# Patient Record
Sex: Female | Born: 1995 | Race: White | Hispanic: Yes | Marital: Single | Smoking: Never smoker
Health system: Southern US, Community
[De-identification: ages and names within clinical notes are randomized; demographics above are authoritative.]

## PROBLEM LIST (undated history)

## (undated) DIAGNOSIS — IMO0001 Reserved for inherently not codable concepts without codable children: Secondary | ICD-10-CM

## (undated) HISTORY — DX: Reserved for inherently not codable concepts without codable children: IMO0001

## (undated) HISTORY — PX: NO PAST SURGERIES: SHX2092

---

## 2016-01-12 ENCOUNTER — Encounter (HOSPITAL_COMMUNITY): Payer: Self-pay | Admitting: Emergency Medicine

## 2016-01-12 DIAGNOSIS — K625 Hemorrhage of anus and rectum: Secondary | ICD-10-CM | POA: Insufficient documentation

## 2016-01-12 LAB — COMPREHENSIVE METABOLIC PANEL
ALT: 23 U/L (ref 14–54)
ANION GAP: 11 (ref 5–15)
AST: 29 U/L (ref 15–41)
Albumin: 4 g/dL (ref 3.5–5.0)
Alkaline Phosphatase: 76 U/L (ref 38–126)
BILIRUBIN TOTAL: 0.4 mg/dL (ref 0.3–1.2)
BUN: 10 mg/dL (ref 6–20)
CHLORIDE: 106 mmol/L (ref 101–111)
CO2: 23 mmol/L (ref 22–32)
Calcium: 9.8 mg/dL (ref 8.9–10.3)
Creatinine, Ser: 0.66 mg/dL (ref 0.44–1.00)
GFR calc Af Amer: >60 mL/min (ref >60)
GFR calc non Af Amer: >60 mL/min (ref >60)
GLUCOSE: 103 mg/dL — AB (ref 65–99)
POTASSIUM: 3.7 mmol/L (ref 3.5–5.1)
SODIUM: 140 mmol/L (ref 135–145)
Total Protein: 7.4 g/dL (ref 6.5–8.1)

## 2016-01-12 LAB — CBC
HEMATOCRIT: 39.9 % (ref 36.0–46.0)
HEMOGLOBIN: 13.9 g/dL (ref 12.0–15.0)
MCH: 29.3 pg (ref 26.0–34.0)
MCHC: 34.8 g/dL (ref 30.0–36.0)
MCV: 84.2 fL (ref 78.0–100.0)
Platelets: 299 10*3/uL (ref 150–400)
RBC: 4.74 MIL/uL (ref 3.87–5.11)
RDW: 13.1 % (ref 11.5–15.5)
WBC: 9.2 10*3/uL (ref 4.0–10.5)

## 2016-01-12 LAB — TYPE AND SCREEN
ABO/RH(D): O POS
ANTIBODY SCREEN: NEGATIVE

## 2016-01-12 NOTE — ED Notes (Signed)
Pt c/o blood in stool x3 weeks. States every bowel movement she has is bright red blood in it. Denies dizziness, n/v/d, or abdominal pain. Ambulatory, aaox4, in nad.

## 2016-01-13 ENCOUNTER — Emergency Department (HOSPITAL_COMMUNITY)
Admission: EM | Admit: 2016-01-13 | Discharge: 2016-01-13 | Disposition: A | Discharge status: Home / Self Care | Payer: Self-pay | Attending: Emergency Medicine | Admitting: Emergency Medicine

## 2016-01-13 DIAGNOSIS — K625 Hemorrhage of anus and rectum: Secondary | ICD-10-CM

## 2016-01-13 LAB — ABO/RH: ABO/RH(D): O POS

## 2016-01-13 LAB — POC OCCULT BLOOD, ED: FECAL OCCULT BLD: POSITIVE — AB

## 2016-01-13 NOTE — ED Provider Notes (Signed)
CSN: [id number]     Arrival date & time 01/12/16  2058 History  By signing my name below, I, Rohini Rajnarayanan, attest that this documentation has been prepared under the direction and in the presence of Pricilla Loveless, MD Electronically Signed: Charlean Merl, ED Scribe 01/13/2016 at 2:01 AM.  Chief Complaint  Patient presents with  . Rectal Bleeding   The history is provided by the patient. No language interpreter was used.   HPI Comments: Priscilla Ortiz is a 20 y.o. female who presents to the Emergency Department complaining of hematochezia x3 weeks. Pt only has bowel movements every couple of days. Pt states that the blood is bright red and is in the water along with her stool. Pt denies any constipation, pain with bowel movements, dizziness, n/v/d, fever, dysuria, hematuria, or abdominal pain. Pt denies any long distance travel.  No family history of Chron's or Ulcerative Colitis.   History reviewed. No pertinent past medical history. History reviewed. No pertinent past surgical history. No family history on file. Social History  Substance Use Topics  . Smoking status: Never Smoker   . Smokeless tobacco: None  . Alcohol Use: No   OB History    No data available     Review of Systems  Constitutional: Negative for fever.  Gastrointestinal: Positive for blood in stool (Bright red blood). Negative for nausea, vomiting, abdominal pain, diarrhea and constipation.  Genitourinary: Negative for dysuria and hematuria.  Neurological: Negative for dizziness.  All other systems reviewed and are negative.  Allergies  Review of patient's allergies indicates no known allergies.  Home Medications   Prior to Admission medications   Not on File   BP 115/76 mmHg  Pulse 95  Temp(Src) 98.7 F (37.1 C) (Oral)  Resp 16  SpO2 98%  LMP 01/05/2016 Physical Exam  Constitutional: She is oriented to person, place, and time. She appears well-developed and well-nourished.  HENT:   Head: Normocephalic and atraumatic.  Right Ear: External ear normal.  Left Ear: External ear normal.  Nose: Nose normal.  Eyes: Right eye exhibits no discharge. Left eye exhibits no discharge.  Cardiovascular: Normal rate, regular rhythm and normal heart sounds.   Pulmonary/Chest: Effort normal and breath sounds normal.  Abdominal: Soft. There is no tenderness.  Genitourinary:  Small skin tag vs. Non-thrombosed hemorrhoid at 6PM. No palpable rectal abnormalities. Minimal brown stool with no gross blood.   Neurological: She is alert and oriented to person, place, and time.  Skin: Skin is warm and dry.  Nursing note and vitals reviewed.   ED Course  Procedures  DIAGNOSTIC STUDIES: Oxygen Saturation is 98% on RA, normal by my interpretation.    COORDINATION OF CARE: 1:43 AM-Discussed treatment plan which includes blood work, hemoccult, and cardiac monitoring with pt at bedside and pt agreed to plan.   Labs Review Labs Reviewed  COMPREHENSIVE METABOLIC PANEL - Abnormal; Notable for the following:    Glucose, Bld 103 (*)    All other components within normal limits  POC OCCULT BLOOD, ED - Abnormal; Notable for the following:    Fecal Occult Bld POSITIVE (*)    All other components within normal limits  CBC  TYPE AND SCREEN  ABO/RH    Imaging Review No results found. I have personally reviewed and evaluated these images and lab results as part of my medical decision-making.   EKG Interpretation None      MDM   Final diagnoses:  Bright red blood per rectum    Patient  is well appearing here with normal vitals and benign exam. Occult blood positive on exam but no gross blood. No abdominal pain, vomiting, or fevers. No history of IBD or FH. Given the length of symptoms (3 weeks) without acute instability or signs that suggest more severe disease, will refer to GI as outpatient.    I personally performed the services described in this documentation, which was scribed in my  presence. The recorded information has been reviewed and is accurate.      Pricilla Loveless, MD 01/13/16 [phone]

## 2016-06-25 ENCOUNTER — Encounter (INDEPENDENT_AMBULATORY_CARE_PROVIDER_SITE_OTHER): Payer: Self-pay

## 2016-06-25 ENCOUNTER — Ambulatory Visit (INDEPENDENT_AMBULATORY_CARE_PROVIDER_SITE_OTHER): Payer: 59 | Admitting: Internal Medicine

## 2016-06-25 ENCOUNTER — Encounter: Payer: Self-pay | Admitting: Internal Medicine

## 2016-06-25 VITALS — BP 90/54 | HR 76 | Ht 58.25 in | Wt 125.1 lb

## 2016-06-25 DIAGNOSIS — K625 Hemorrhage of anus and rectum: Secondary | ICD-10-CM | POA: Diagnosis not present

## 2016-06-25 NOTE — Patient Instructions (Signed)

## 2016-06-25 NOTE — Progress Notes (Signed)
HISTORY OF PRESENT ILLNESS:  Priscilla Ortiz is a 20 y.o. female , [employer] student who is referred by the [facility] room emergency physician Dr. Criss Alvine regarding rectal bleeding. Patient reports problems with rectal bleeding since January 2017. She describes 2-3 days of bright red blood per rectum every few weeks. She notices blood in the toilet bowl and on the tissue. She denies significant associated rectal pain. She has had no change in her bowel habits and describes a regular bowel movement every other day. She denies abdominal pain or weight loss. No family history of colon cancer. Review of outside records from the emergency room shows unremarkable comprehensive metabolic panel and CBC. Hemoglobin was 13.9. Rectal exam in the emergency room was said to show a small tag without palpable abnormalities and brown stool. The stool was Hemoccult positive. GI referral recommended.  REVIEW OF SYSTEMS:  All non-GI ROS negative upon comprehensive review  Past Medical History:  Diagnosis Date  . No diagnosis     Past Surgical History:  Procedure Laterality Date  . NO PAST SURGERIES      Social History Priscilla Ortiz  reports that she has never smoked. She has never used smokeless tobacco. She reports that she does not drink alcohol or use drugs.  Family history is unknown by patient.  No Known Allergies     PHYSICAL EXAMINATION: Vital signs: BP (!) 90/54 (BP Location: Left Arm, Patient Position: Sitting, Cuff Size: Normal)   Pulse 76   Ht 4' 10.25" (1.48 m) Comment: height measured without shoes  Wt 125 lb 2 oz (56.8 kg)   LMP 06/11/2016   BMI 25.93 kg/m   Constitutional: generally well-appearing, no acute distress Psychiatric: alert and oriented x3, cooperative Eyes: extraocular movements intact, anicteric, conjunctiva pink Mouth: oral pharynx moist, no lesions Neck: supple no lymphadenopathy Cardiovascular: heart regular rate and rhythm, no murmur Lungs: clear to  auscultation bilaterally Abdomen: soft, nontender, nondistended, no obvious ascites, no peritoneal signs, normal bowel sounds, no organomegaly Rectal: No external abnormalities. No tenderness or mass. Stool is brown and Hemoccult positive Extremities: no clubbing cyanosis or lower extremity edema bilaterally Skin: no lesions on visible extremities Neuro: No focal deficits. Normal DTRs  ASSESSMENT:  #1. Ongoing intermittent rectal bleeding of uncertain etiology. #2. Hemoccult-positive stool on 2 separate occasions   PLAN:  #1. Colonoscopy.The nature of the procedure, as well as the risks, benefits, and alternatives were carefully and thoroughly reviewed with the patient. Ample time for discussion and questions allowed. The patient understood, was satisfied, and agreed to proceed.  A copy of this consultation note has been sent to Dr. Criss Alvine

## 2016-07-01 ENCOUNTER — Ambulatory Visit (AMBULATORY_SURGERY_CENTER): Payer: 59 | Admitting: Internal Medicine

## 2016-07-01 ENCOUNTER — Encounter: Payer: Self-pay | Admitting: Internal Medicine

## 2016-07-01 VITALS — BP 111/56 | HR 87 | Temp 97.7°F | Resp 19 | Ht <= 58 in | Wt 125.0 lb

## 2016-07-01 DIAGNOSIS — K625 Hemorrhage of anus and rectum: Secondary | ICD-10-CM | POA: Diagnosis present

## 2016-07-01 DIAGNOSIS — R195 Other fecal abnormalities: Secondary | ICD-10-CM

## 2016-07-01 MED ORDER — SODIUM CHLORIDE 0.9 % IV SOLN: 500.0000 mL | INTRAVENOUS | Status: DC

## 2016-07-01 NOTE — Op Note (Signed)
[facility] Patient Name: Priscilla Ortiz Procedure Date: 07/01/2016 7:56 AM MRN: [id number] Endoscopist: Wilhemina Bonito. Marina Goodell , MD Age: 20 Referring MD:  Date of Birth: [birth year] Gender: Female Account #: [id number] Procedure:                Colonoscopy Indications:              Heme positive stool, Rectal bleeding Medicines:                Monitored Anesthesia Care Procedure:                Pre-Anesthesia Assessment:                           - Prior to the procedure, a History and Physical                            was performed, and patient medications and                            allergies were reviewed. The patient's tolerance of                            previous anesthesia was also reviewed. The risks                            and benefits of the procedure and the sedation                            options and risks were discussed with the patient.                            All questions were answered, and informed consent                            was obtained. Prior Anticoagulants: The patient has                            taken no previous anticoagulant or antiplatelet                            agents. ASA Grade Assessment: I - A normal, healthy                            patient. After reviewing the risks and benefits,                            the patient was deemed in satisfactory condition to                            undergo the procedure.                           After obtaining informed consent, the colonoscope  was passed under direct vision. Throughout the                            procedure, the patient's blood pressure, pulse, and                            oxygen saturations were monitored continuously. The                            Model CF-HQ190L [id number]) scope was introduced                            through the anus and advanced to the the cecum,                            identified by appendiceal orifice  and ileocecal                            valve. The ileocecal valve, appendiceal orifice,                            and rectum were photographed. The quality of the                            bowel preparation was excellent. The colonoscopy                            was performed without difficulty. The patient                            tolerated the procedure well. The bowel preparation                            used was SUPREP. Scope In: 8:26:22 AM Scope Out: 8:34:20 AM Scope Withdrawal Time: 0 hours 5 minutes 58 seconds  Total Procedure Duration: 0 hours 7 minutes 58 seconds  Findings:                 The digital rectal exam was normal.                           The entire examined colon appeared normal on direct                            and retroflexion views. Complications:            No immediate complications. Estimated blood loss:                            None. Estimated Blood Loss:     Estimated blood loss: none. Impression:               - The entire examined colon is normal on direct and                            retroflexion  views.                           - No specimens collected. Recommendation:           - Repeat colonoscopy at age 72 for screening                            purposes.                           - Patient has a contact number available for                            emergencies. The signs and symptoms of potential                            delayed complications were discussed with the                            patient. Return to normal activities tomorrow.                            Written discharge instructions were provided to the                            patient.                           - Resume previous diet.                           - Continue present medications. Wilhemina Bonito. Marina Goodell, MD 07/01/2016 8:42:17 AM This report has been signed electronically.

## 2016-07-01 NOTE — Progress Notes (Signed)
To pacu vss patent aw reprot to rn 

## 2016-07-01 NOTE — Patient Instructions (Signed)
YOU HAD AN ENDOSCOPIC PROCEDURE TODAY AT THE [facility]:   Refer to the procedure report that was given to you for any specific questions about what was found during the examination.  If the procedure report does not answer your questions, please call your gastroenterologist to clarify.  If you requested that your care partner not be given the details of your procedure findings, then the procedure report has been included in a sealed envelope for you to review at your convenience later.  YOU SHOULD EXPECT: Some feelings of bloating in the abdomen. Passage of more gas than usual.  Walking can help get rid of the air that was put into your GI tract during the procedure and reduce the bloating. If you had a lower endoscopy (such as a colonoscopy or flexible sigmoidoscopy) you may notice spotting of blood in your stool or on the toilet paper. If you underwent a bowel prep for your procedure, you may not have a normal bowel movement for a few days.  Please Note:  You might notice some irritation and congestion in your nose or some drainage.  This is from the oxygen used during your procedure.  There is no need for concern and it should clear up in a day or so.  SYMPTOMS TO REPORT IMMEDIATELY:   Following lower endoscopy (colonoscopy or flexible sigmoidoscopy):  Excessive amounts of blood in the stool  Significant tenderness or worsening of abdominal pains  Swelling of the abdomen that is new, acute  Fever of 100F or higher   For urgent or emergent issues, a gastroenterologist can be reached at any hour by calling [phone].   DIET: Your first meal following the procedure should be a small meal and then it is ok to progress to your normal diet. Heavy or fried foods are harder to digest and may make you feel nauseous or bloated.  Likewise, meals heavy in dairy and vegetables can increase bloating.  Drink plenty of fluids but you should avoid alcoholic beverages for 24  hours.  ACTIVITY:  You should plan to take it easy for the rest of today and you should NOT DRIVE or use heavy machinery until tomorrow (because of the sedation medicines used during the test).    FOLLOW UP: Our staff will call the number listed on your records the next business day following your procedure to check on you and address any questions or concerns that you may have regarding the information given to you following your procedure. If we do not reach you, we will leave a message.  However, if you are feeling well and you are not experiencing any problems, there is no need to return our call.  We will assume that you have returned to your regular daily activities without incident.  If any biopsies were taken you will be contacted by phone or by letter within the next 1-3 weeks.  Please call us at [phone] if you have not heard about the biopsies in 3 weeks.    SIGNATURES/CONFIDENTIALITY: You and/or your care partner have signed paperwork which will be entered into your electronic medical record.  These signatures attest to the fact that that the information above on your After Visit Summary has been reviewed and is understood.  Full responsibility of the confidentiality of this discharge information lies with you and/or your care-partner.   Resume medications. 

## 2016-07-02 ENCOUNTER — Telehealth: Payer: Self-pay | Admitting: *Deleted

## 2016-07-02 NOTE — Telephone Encounter (Signed)
  Follow up Call-  Call back number 07/01/2016  Post procedure Call Back phone  # [phone]  Permission to leave phone message Yes     No answer, left message

## 2016-09-09 ENCOUNTER — Ambulatory Visit (INDEPENDENT_AMBULATORY_CARE_PROVIDER_SITE_OTHER): Payer: 59 | Admitting: Pediatrics

## 2016-09-09 ENCOUNTER — Encounter: Payer: Self-pay | Admitting: Pediatrics

## 2016-09-09 VITALS — BP 100/66 | HR 86 | Ht <= 58 in | Wt 131.0 lb

## 2016-09-09 DIAGNOSIS — Z3202 Encounter for pregnancy test, result negative: Secondary | ICD-10-CM | POA: Diagnosis not present

## 2016-09-09 DIAGNOSIS — Z30017 Encounter for initial prescription of implantable subdermal contraceptive: Secondary | ICD-10-CM

## 2016-09-09 DIAGNOSIS — Z3042 Encounter for surveillance of injectable contraceptive: Secondary | ICD-10-CM | POA: Insufficient documentation

## 2016-09-09 DIAGNOSIS — Z113 Encounter for screening for infections with a predominantly sexual mode of transmission: Secondary | ICD-10-CM

## 2016-09-09 LAB — POCT RAPID HIV: Rapid HIV, POC: NEGATIVE

## 2016-09-09 LAB — POCT URINE PREGNANCY: Preg Test, Ur: NEGATIVE

## 2016-09-09 MED ORDER — ETONOGESTREL 68 MG ~~LOC~~ IMPL
68.0000 mg | DRUG_IMPLANT | Freq: Once | SUBCUTANEOUS | Status: AC
  Administered 2016-09-09: 68 mg via SUBCUTANEOUS

## 2016-09-09 NOTE — Progress Notes (Signed)
Nexplanon Insertion  No contraindications for placement.  No liver disease, no unexplained vaginal bleeding, no h/o breast cancer, no h/o blood clots.  No LMP recorded (lmp unknown).  UHCG: Negative  Last Unprotected sex:  None- is on depo and in window  Risks & benefits of Nexplanon discussed The nexplanon device was purchased and [facility]. Packaging instructions supplied to patient Consent form signed  The patient denies any allergies to anesthetics or antiseptics.  Procedure: Pt was placed in supine position. The left arm was flexed at the elbow and externally rotated so that her wrist was parallel to her ear The medial epicondyle of the left arm was identified The insertions site was marked 8 cm proximal to the medial epicondyle The insertion site was cleaned with Betadine The area surrounding the insertion site was covered with a sterile drape 1% lidocaine was injected just under the skin at the insertion site extending 4 cm proximally. The sterile preloaded disposable Nexaplanon applicator was removed from the sterile packaging The applicator needle was inserted at a 30 degree angle at 8 cm proximal to the medial epicondyle as marked The applicator was lowered to a horizontal position and advanced just under the skin for the full length of the needle The slider on the applicator was retracted fully while the applicator remained in the same position, then the applicator was removed. The implant was confirmed via palpation as being in position The implant position was demonstrated to the patient Pressure dressing was applied to the patient.  The patient was instructed to removed the pressure dressing in 24 hrs.  The patient was advised to move slowly from a supine to an upright position  The patient denied any concerns or complaints  The patient was instructed to schedule a follow-up appt in 1 month and to call sooner if any concerns.  The patient acknowledged  agreement and understanding of the plan.

## 2016-09-09 NOTE — Patient Instructions (Signed)
Follow-up with Dr. Perry in 1 month. Schedule this appointment before you leave clinic today.  Congratulations on getting your Nexplanon placement!  Below is some important information about Nexplanon.  First remember that Nexplanon does not prevent sexually transmitted infections.  Condoms will help prevent sexually transmitted infections. The Nexplanon starts working 7 days after it was inserted.  There is a risk of getting pregnant if you have unprotected sex in those first 7 days after placement of the Nexplanon.  The Nexplanon lasts for 3 years but can be removed at any time.  You can become pregnant as early as 1 week after removal.  You can have a new Nexplanon put in after the old one is removed if you like.  It is not known whether Nexplanon is as effective in women who are very overweight because the studies did not include many overweight women.  Nexplanon interacts with some medications, including barbiturates, bosentan, carbamazepine, felbamate, griseofulvin, oxcarbazepine, phenytoin, rifampin, St. John's wort, topiramate, HIV medicines.  Please alert your doctor if you are on any of these medicines.  Always tell other healthcare providers that you have a Nexplanon in your arm.  The Nexplanon was placed just under the skin.  Leave the outside bandage on for 24 hours.  Leave the smaller bandage on for 3-5 days or until it falls off on its own.  Keep the area clean and dry for 3-5 days. There is usually bruising or swelling at the insertion site for a few days to a week after placement.  If you see redness or pus draining from the insertion site, call us immediately.  Keep your user card with the date the implant was placed and the date the implant is to be removed.  The most common side effect is a change in your menstrual bleeding pattern.   This bleeding is generally not harmful to you but can be annoying.  Call or come in to see us if you have any concerns about the bleeding or if  you have any side effects or questions.    We will call you in 1 week to check in and we would like you to return to the clinic for a follow-up visit in 1 month.  You can call [facility] 24 hours a day with any questions or concerns.  There is always a nurse or doctor available to take your call.  Call 9-1-1 if you have a life-threatening emergency.  For anything else, please call us at [phone] before heading to the ER.  

## 2016-09-09 NOTE — Progress Notes (Signed)
Adolescent Medicine Consultation Initial Visit Priscilla DupesMarilyn Ortiz  is a 20 y.o. female referred by No PCP Per Patient ([facility]) here today for evaluation of contraception options.    Previsit planning completed:  not applicable  Growth Chart Viewed? yes   PCP Confirmed?  Yes - [facility]   History was provided by the patient.  HPI:  Came in today because her friend has a Nexplanon and she would like a Nexplanon placed as well.  Menarche at 20yo with really heavy periods and really painful.  She was first on contraception starting at 1.5 year ago.  She was on OCPs for one week but forgot about them so then was started on Depo x 9 months total (3 total shots), then "told the wall of her heart was inflamed" so stopped birth control for 9 months, then restarted Depo August 2016.  Last shot 06/12/16.  She said she's gained "a lot" of weight in the last 15 months but can't tell if it's because she started college or because of the Depo.  Her periods are regular every 3 months on Depo, 4-5 days, initially heavy and then becoming light, limited cramping.  She has no history of STIs.  Her pregnancy intentions are to get pregnant in 5 years.  Last period was three months ago before her last Depo shot.  In a relationship with a female partner, 1 sexually partner in the last 6 months, 2 days ago was most recent sexual encounter.  Vaginal intercourse only.  No history of migraines, DVT, hypertension, or tobacco use.  No LMP recorded (lmp unknown).  ROS   The following portions of the patient's history were reviewed and updated as appropriate: allergies, current medications, past family history, past medical history, past social history, past surgical history and problem list.  No Known Allergies  Past Medical History:  She has a history of painless rectal bleeding and has been evaluated by GI including colonoscopy; was told "likely related to her poor diet." Past Medical History:  Diagnosis Date   . No diagnosis     Family History: None, no hypertension Family History  Problem Relation Age of Onset  . Family history unknown: Yes    Social History: Lives with: apartment with roommates Parental relations: good Siblings: 10yo brother; and 13yo sister Friends/Peers: she has 3 friends in college who she's close to [employer] Future Plans: Orthodonist (but doesn't like her Nutrition/Eating Behaviors: Doesn't like vegetables; goes out to eat often Sports/Exercise: Gym - treadmill, weight lifting, sauna Screen time: all the time Sleep: 8 hours per night  Confidentiality was discussed with the patient and if applicable, with caregiver as well.  Patient's personal or confidential phone number: [phone] Tobacco? no Secondhand smoke exposure?no Drugs/EtOH?no Sexually active?yes, one female partner in last 6 months Pregnancy Prevention: Depo, reviewed condoms & plan B Safe at home, in school & in relationships? Yes Guns in the home? no Safe to self? Yes - denies SI, self-harm  Physical Exam:  Vitals:   09/09/16 1414  BP: 100/66  Pulse: 86  Weight: 131 lb (59.4 kg)  Height: 4' 9.87" (1.47 m)   BP 100/66   Pulse 86   Ht 4' 9.87" (1.47 m)   Wt 131 lb (59.4 kg)   LMP  (LMP Unknown)   BMI 27.50 kg/m  Body mass index: body mass index is 27.5 kg/m. Blood pressure percentiles are 25 % systolic and 61 % diastolic based on NHBPEP's 4th Report. Blood pressure  percentile targets: 90: 120/77, 95: 124/81, 99 + 5 mmHg: 136/94.  Physical Exam  Constitutional: She appears well-developed and well-nourished. No distress.  HENT:  Head: Normocephalic and atraumatic.  Mouth/Throat: Oropharynx is clear and moist.  Eyes: Conjunctivae are normal. Pupils are equal, round, and reactive to light.  Neck: Normal range of motion.  Cardiovascular: Normal rate, regular rhythm, normal heart sounds and intact distal pulses.   Pulmonary/Chest: Effort normal and breath  sounds normal. No respiratory distress. She has no wheezes.  Abdominal: Soft. Bowel sounds are normal. She exhibits no distension. There is no tenderness.  Skin: Skin is warm and dry. No rash noted.  Psychiatric: She has a normal mood and affect.  Vitals reviewed.   Assessment/Plan: 20yo previously healthy F here for Nexplanon placement.  Placed without complication (see separate note).  Priscilla Ortiz was seen today for new evaluation and reproductive health.  Diagnoses and all orders for this visit:  Insertion of Nexplanon -     etonogestrel (NEXPLANON) implant 68 mg; 68 mg by Subdermal route once. - Advised to refrain from sexual intercourse or use condom for 7 days, though covered by her last Depo shot until 09/14/16 - Advised to return to care if concern for excessive bruising or pain at the site, or heavier or more painful periods  Pregnancy examination or test, negative result -     POCT urine pregnancy: negative  Routine screening for STI (sexually transmitted infection) -     GC/Chlamydia Probe Amp: pending -     POCT Rapid HIV: Negative - Counseling provided on STI prevention and symptoms   Follow-up:   Return if symptoms worsen or fail to improve, for Nexplanon follow-up.   Medical decision-making:  > 40 minutes spent, more than 50% of appointment was spent discussing diagnosis and management of symptoms  Russ Halo, MD [facility], PGY-2

## 2016-09-10 LAB — GC/CHLAMYDIA PROBE AMP
CT PROBE, AMP APTIMA: NOT DETECTED
GC PROBE AMP APTIMA: NOT DETECTED

## 2016-09-10 NOTE — Progress Notes (Signed)
LVM to notify patient of negative results.

## 2016-09-15 ENCOUNTER — Institutional Professional Consult (permissible substitution): Payer: Self-pay | Admitting: Pediatrics

## 2017-08-19 ENCOUNTER — Ambulatory Visit: Payer: 59 | Admitting: Pediatrics

## 2017-09-06 ENCOUNTER — Other Ambulatory Visit: Payer: Self-pay | Admitting: Pediatrics

## 2017-09-06 ENCOUNTER — Encounter: Payer: Self-pay | Admitting: Pediatrics

## 2017-09-06 ENCOUNTER — Ambulatory Visit (INDEPENDENT_AMBULATORY_CARE_PROVIDER_SITE_OTHER): Payer: 59 | Admitting: Pediatrics

## 2017-09-06 VITALS — BP 99/70 | HR 78 | Ht <= 58 in | Wt 135.8 lb

## 2017-09-06 DIAGNOSIS — Z113 Encounter for screening for infections with a predominantly sexual mode of transmission: Secondary | ICD-10-CM

## 2017-09-06 DIAGNOSIS — Z3042 Encounter for surveillance of injectable contraceptive: Secondary | ICD-10-CM | POA: Diagnosis not present

## 2017-09-06 DIAGNOSIS — Z3202 Encounter for pregnancy test, result negative: Secondary | ICD-10-CM | POA: Diagnosis not present

## 2017-09-06 LAB — POCT URINE PREGNANCY: Preg Test, Ur: NEGATIVE

## 2017-09-06 MED ORDER — MEDROXYPROGESTERONE ACETATE 150 MG/ML IM SUSP
150.0000 mg | Freq: Once | INTRAMUSCULAR | Status: AC
  Administered 2017-09-06: 150 mg via INTRAMUSCULAR

## 2017-09-06 NOTE — Progress Notes (Signed)
THIS RECORD MAY CONTAIN CONFIDENTIAL INFORMATION THAT SHOULD NOT BE RELEASED WITHOUT REVIEW OF THE SERVICE PROVIDER.  Adolescent Medicine Consultation Follow-Up Visit Priscilla Ortiz  is a 21 y.o. female here today for follow-up regarding depo shot.    Last seen in [facility]cent Medicine Clinic on 09/09/2016 for insertion of Nexplanon.  Plan at last visit included return if heavier or more painful periods.  Pertinent Labs? No Growth Chart Viewed? yes   History was provided by the patient.  Interpreter? no  PCP Confirmed?  no  My Chart Activated?   no   Chief Complaint  Patient presents with  . Follow-up  . REPRODUCTIVE HEALTH    HPI:   Patient is here to get Depo shot. She had Depo for a couple years, she switched to nexplanon (08/2016) so she didn't have to come in every 3 months. With nexplanon she had daily spotting, and unstable mood. She got her nexplanon removed a month ago at [facility] area Biomedical [facility] office). She made an appointment at our office but the next appointment would be in 3 weeks. She would like to keep coming to our office for depo shots.   Last period was Thursday 09/02/17, ended yesterday.   Had colonscopy last year for rectal bleeding, that showed no abnormalities. She has been seen by 2 GI specialists. She continues to have bright red blood with her stools. She has a BM every other day, not particularly hard stools.    Patient's last menstrual period was 08/26/2017. No Known Allergies Outpatient Medications Prior to Visit  Medication Sig Dispense Refill  . MedroxyPROGESTERone Acetate 150 MG/ML SUSY Inject 1 Dose into the muscle every 3 (three) months.     Facility-Administered Medications Prior to Visit  Medication Dose Route Frequency Provider Last Rate Last Dose  . 0.9 %  sodium chloride infusion  500 mL Intravenous Continuous Hilarie Fredrickson, MD         Patient Active Problem List   Diagnosis Date Noted  . Surveillance for Depo-Provera contraception  09/09/2016    Social History: Changes with school since last visit?  no  Activities:  Special interests/hobbies/sports: None  Confidentiality was discussed with the patient and if applicable, with caregiver as well.  Changes at home or school since last visit:  no  Gender identity: Female Sex assigned at birth: Female Pronouns: she Tobacco?  no Drugs/ETOH?  no Partner preference?  female  Sexually Active?  yes  Pregnancy Prevention:  condoms Reviewed condoms:  yes Reviewed EC:  no   Suicidal or homicidal thoughts?   No Self injurious behaviors?  no Guns in the home?  no    The following portions of the patient's history were reviewed and updated as appropriate: allergies, current medications, past family history, past medical history, past social history, past surgical history and problem list.  Physical Exam:  Vitals:   09/06/17 1015  BP: 99/70  Pulse: 78  Weight: 135 lb 12.8 oz (61.6 kg)  Height:  (1.473 m)   BP 99/70 (BP Location: Right Arm, Patient Position: Sitting, Cuff Size: Normal)   Pulse 78   Ht  (1.473 m)   Wt 135 lb 12.8 oz (61.6 kg)   LMP 08/26/2017   BMI 28.38 kg/m  Body mass index: body mass index is 28.38 kg/m. Growth percentile SmartLinks can only be used for patients less than 68 years old.  Physical Exam  Constitutional: She is oriented to person, place, and time. She appears well-developed and well-nourished. No  distress.  HENT:  Head: Normocephalic and atraumatic.  Neck: Normal range of motion.  Cardiovascular: Normal rate, regular rhythm and normal heart sounds.   Pulmonary/Chest: Effort normal.  Musculoskeletal: Normal range of motion.  Neurological: She is alert and oriented to person, place, and time.  Skin: Skin is warm.  Psychiatric: She has a normal mood and affect. Her behavior is normal. Judgment and thought content normal.   Assessment/Plan: Priscilla Ortiz is 20 year old female here for changing contraception methods.  Nexplanon was removed successfully at another facility. She has decided to re-start Depo shots as her primary method of birth control. We also discussed her rectal bleeding which are most likely due to internal hemorrhoids. I recommended daily use of Miralax to promote softer stools and regular bowel movements.   1. Surveillance for Depo-Provera contraception - Depo shot given in office by nursing staff - Will return in 3 months for next Depo shot  2. Routine screening for STI (sexually transmitted infection) - C. trachomatis/N. gonorrhoeae RNA: pending  3. Pregnancy examination or test, negative result - POCT urine pregnancy: negative  Follow-up:  3 months for next Depo shot   Medical decision-making:  >25 minutes spent face to face with patient with more than 50% of appointment spent discussing diagnosis, management, follow-up, and reviewing of plan of care.

## 2017-09-07 LAB — C. TRACHOMATIS/N. GONORRHOEAE RNA
C. TRACHOMATIS RNA, TMA: NOT DETECTED
N. GONORRHOEAE RNA, TMA: NOT DETECTED

## 2017-11-19 ENCOUNTER — Ambulatory Visit: Payer: Self-pay

## 2017-11-22 ENCOUNTER — Ambulatory Visit (INDEPENDENT_AMBULATORY_CARE_PROVIDER_SITE_OTHER): Payer: 59 | Admitting: *Deleted

## 2017-11-22 DIAGNOSIS — Z3042 Encounter for surveillance of injectable contraceptive: Secondary | ICD-10-CM | POA: Diagnosis not present

## 2017-11-22 MED ORDER — MEDROXYPROGESTERONE ACETATE 150 MG/ML IM SUSP
150.0000 mg | Freq: Once | INTRAMUSCULAR | Status: AC
  Administered 2017-11-22: 150 mg via INTRAMUSCULAR

## 2017-11-22 NOTE — Progress Notes (Signed)
Here for depo injection. Dose within "window" from last dose so no pregnancy test needed.  Tolerated well. Next appointment scheduled.

## 2018-02-07 ENCOUNTER — Ambulatory Visit (INDEPENDENT_AMBULATORY_CARE_PROVIDER_SITE_OTHER): Payer: 59

## 2018-02-07 DIAGNOSIS — Z3042 Encounter for surveillance of injectable contraceptive: Secondary | ICD-10-CM | POA: Diagnosis not present

## 2018-02-07 MED ORDER — MEDROXYPROGESTERONE ACETATE 150 MG/ML IM SUSP
150.0000 mg | Freq: Once | INTRAMUSCULAR | Status: AC
  Administered 2018-02-07: 150 mg via INTRAMUSCULAR

## 2018-02-07 NOTE — Progress Notes (Signed)
Pt presents for depo injection. Pt within depo window, no urine hcg needed. Injection given, tolerated well. F/u depo injection visit scheduled.   

## 2018-04-20 ENCOUNTER — Ambulatory Visit (INDEPENDENT_AMBULATORY_CARE_PROVIDER_SITE_OTHER): Payer: 59

## 2018-04-20 DIAGNOSIS — Z3042 Encounter for surveillance of injectable contraceptive: Secondary | ICD-10-CM

## 2018-04-20 MED ORDER — MEDROXYPROGESTERONE ACETATE 150 MG/ML IM SUSP
150.0000 mg | Freq: Once | INTRAMUSCULAR | Status: AC
  Administered 2018-04-20: 150 mg via INTRAMUSCULAR

## 2018-04-20 NOTE — Progress Notes (Signed)
Pt presents for depo injection. Pt within depo window, no urine hcg needed. Injection given, tolerated well. F/u depo injection visit scheduled.   

## 2018-07-06 ENCOUNTER — Ambulatory Visit: Payer: 59

## 2018-07-06 ENCOUNTER — Ambulatory Visit (INDEPENDENT_AMBULATORY_CARE_PROVIDER_SITE_OTHER): Payer: 59

## 2018-07-06 DIAGNOSIS — Z3042 Encounter for surveillance of injectable contraceptive: Secondary | ICD-10-CM

## 2018-07-06 MED ORDER — MEDROXYPROGESTERONE ACETATE 150 MG/ML IM SUSP
150.0000 mg | Freq: Once | INTRAMUSCULAR | Status: AC
  Administered 2018-07-06: 150 mg via INTRAMUSCULAR

## 2018-07-06 NOTE — Progress Notes (Signed)
Pt presents for depo injection. Pt within depo window, no urine hcg needed. Injection given, tolerated well. F/u depo injection visit scheduled.   

## 2018-09-21 ENCOUNTER — Ambulatory Visit: Payer: 59

## 2018-12-31 ENCOUNTER — Inpatient Hospital Stay (HOSPITAL_COMMUNITY)
Admission: AD | Admit: 2018-12-31 | Discharge: 2018-12-31 | Disposition: A | Discharge status: Home / Self Care | Payer: 59 | Source: Ambulatory Visit | Attending: Obstetrics and Gynecology | Admitting: Obstetrics and Gynecology

## 2018-12-31 ENCOUNTER — Other Ambulatory Visit (HOSPITAL_COMMUNITY): Payer: Self-pay

## 2018-12-31 ENCOUNTER — Encounter (HOSPITAL_COMMUNITY): Payer: Self-pay | Admitting: *Deleted

## 2018-12-31 DIAGNOSIS — R109 Unspecified abdominal pain: Secondary | ICD-10-CM | POA: Diagnosis not present

## 2018-12-31 DIAGNOSIS — O0281 Inappropriate change in quantitative human chorionic gonadotropin (hCG) in early pregnancy: Secondary | ICD-10-CM | POA: Diagnosis not present

## 2018-12-31 DIAGNOSIS — Z3202 Encounter for pregnancy test, result negative: Secondary | ICD-10-CM | POA: Insufficient documentation

## 2018-12-31 DIAGNOSIS — N390 Urinary tract infection, site not specified: Secondary | ICD-10-CM | POA: Diagnosis not present

## 2018-12-31 DIAGNOSIS — R319 Hematuria, unspecified: Secondary | ICD-10-CM | POA: Diagnosis not present

## 2018-12-31 LAB — URINALYSIS, ROUTINE W REFLEX MICROSCOPIC
Bilirubin Urine: NEGATIVE
Glucose, UA: NEGATIVE mg/dL
Ketones, ur: NEGATIVE mg/dL
Nitrite: POSITIVE — AB
PH: 5 (ref 5.0–8.0)
Protein, ur: NEGATIVE mg/dL
Specific Gravity, Urine: >1.03 — ABNORMAL HIGH (ref 1.005–1.030)

## 2018-12-31 LAB — URINALYSIS, MICROSCOPIC (REFLEX)

## 2018-12-31 LAB — HCG, QUANTITATIVE, PREGNANCY: hCG, Beta Chain, Quant, S: 26 m[IU]/mL — ABNORMAL HIGH

## 2018-12-31 LAB — POCT PREGNANCY, URINE: Preg Test, Ur: NEGATIVE

## 2018-12-31 MED ORDER — CEPHALEXIN 500 MG PO CAPS: 500.0000 mg | ORAL_CAPSULE | Freq: Two times a day (BID) | ORAL | 0 refills | Status: AC

## 2018-12-31 NOTE — MAU Note (Signed)
Pt states she is having some lower abdominal pain that can be sharp at times more so on the left side that comes and goes.  Denies VB.  Positive pregnancy tests at home this past Monday.

## 2018-12-31 NOTE — MAU Provider Note (Signed)
History     CSN: [id number]  Arrival date and time: 12/31/18 1320   First Provider Initiated Contact with Patient 12/31/18 1422      Chief Complaint  Patient presents with  . Abdominal Pain   Priscilla Ortiz is a 23 y.o. G1P0 at [redacted]w[redacted]d by definite LMP who presents for Abdominal Pain.  Patient reports abdominal pain for the last 4 days that is a constant cramping with an intermittent sharp shooting pain.  Patient denies vaginal concerns including discharge, burning, itching, odor, and irritation.  However, patient reports some burning with urination and endorses constipation but had a bowel movement yesterday that was hard to pass. Patent reports taking ~10 home UPT 3 days ago that were all positive.  Patient states she discontinued her Depo Provera injections in August and has not had a regular menses since.  However, she reports having a 10 day cycle in December which was c/w her cycles prior to depo usage.       OB History    Gravida  0   Para      Term      Preterm      AB      Living        SAB      TAB      Ectopic      Multiple      Live Births              Past Medical History:  Diagnosis Date  . No diagnosis     Past Surgical History:  Procedure Laterality Date  . NO PAST SURGERIES      Family History  Family history unknown: Yes    Social History   Tobacco Use  . Smoking status: Never Smoker  . Smokeless tobacco: Never Used  Substance Use Topics  . Alcohol use: No  . Drug use: No    Allergies: No Known Allergies  No medications prior to admission.    Review of Systems  Gastrointestinal: Positive for abdominal pain.   Physical Exam   Blood pressure 120/65, pulse 89, temperature 98 F (36.7 C), temperature source Oral, resp. rate 16, height 4\' 10"  (1.473 m), weight 71.2 kg, last menstrual period 11/16/2018, SpO2 98 %.  Physical Exam  Constitutional: She is oriented to person, place, and time. She appears well-developed and  well-nourished.  HENT:  Head: Normocephalic and atraumatic.  Eyes: Conjunctivae are normal.  Neck: Normal range of motion.  Cardiovascular: Normal rate.  Respiratory: Effort normal.  GI: Soft.  Genitourinary:    Genitourinary Comments: Declined Pelvic Exam and Cultures   Musculoskeletal: Normal range of motion.  Neurological: She is alert and oriented to person, place, and time.  Skin: Skin is warm and dry.    MAU Course  Procedures Results for orders placed or performed during the hospital encounter of 12/31/18 (from the past 24 hour(s))  Urinalysis, Routine w reflex microscopic     Status: Abnormal   Collection Time: 12/31/18  1:48 PM  Result Value Ref Range   Color, Urine YELLOW YELLOW   APPearance HAZY (A) CLEAR   Specific Gravity, Urine >1.030 (H) 1.005 - 1.030   pH 5.0 5.0 - 8.0   Glucose, UA NEGATIVE NEGATIVE mg/dL   Hgb urine dipstick MODERATE (A) NEGATIVE   Bilirubin Urine NEGATIVE NEGATIVE   Ketones, ur NEGATIVE NEGATIVE mg/dL   Protein, ur NEGATIVE NEGATIVE mg/dL   Nitrite POSITIVE (A) NEGATIVE   Leukocytes, UA TRACE (A) NEGATIVE  Urinalysis, Microscopic (reflex)     Status: Abnormal   Collection Time: 12/31/18  1:48 PM  Result Value Ref Range   RBC / HPF 0-5 0 - 5 RBC/hpf   WBC, UA 0-5 0 - 5 WBC/hpf   Bacteria, UA MANY (A) NONE SEEN   Squamous Epithelial / LPF 6-10 0 - 5  Pregnancy, urine POC     Status: None   Collection Time: 12/31/18  1:49 PM  Result Value Ref Range   Preg Test, Ur NEGATIVE NEGATIVE  hCG, quantitative, pregnancy     Status: Abnormal   Collection Time: 12/31/18  2:41 PM  Result Value Ref Range   hCG, Beta Chain, Quant, S 26 (H) <5 mIU/mL    MDM Labs: UA, hCG  Assessment and Plan  +Home UPT  -Patient informed of negative pregnancy test today. -Will obtain hCG -Patient given option to wait in lobby or go home; wants to wait   Follow Up (4:04 PM) Early Pregnancy  -hCG returns significant for early pregnancy that is less than 1  week based on results. -Results discussed with patient who expresses relief and excitement. -Discussed need for follow up hCG in next 48 hours. -Appt scheduled in [facility] on Tuesday Jan 03, 2019 at 0900. -Precautions given regarding early pregnancy including bleeding, ectopic, and miscarriage. -No questions or concerns -Congratulations given -Discharged to home in stable condition  Cherre RobinsJessica L Asal Teas MSN, CNM 12/31/2018, 2:22 PM   Addendum ( 2010) UTI  -Review of labs reveal UA significant for HgB, Nitrates and many Bacteria. -Patient called and informed of findings. -Rx for Keflex 500mg  BID x 10 sent to pharmacy on file. -OB culture ordered and sent.  -No questions and concerns.   Cherre RobinsJessica L Eleonore Shippee MSN, CNM 9:03 PM

## 2018-12-31 NOTE — Discharge Instructions (Signed)
First Trimester of Pregnancy  The first trimester of pregnancy is from week 1 until the end of week 13 (months 1 through 3). A week after a sperm fertilizes an egg, the egg will implant on the wall of the uterus. This embryo will begin to develop into a baby. Genes from you and your partner will form the baby. The female genes will determine whether the baby will be a boy or a girl. At 6-8 weeks, the eyes and face will be formed, and the heartbeat can be seen on ultrasound. At the end of 12 weeks, all the baby's organs will be formed.  Now that you are pregnant, you will want to do everything you can to have a healthy baby. Two of the most important things are to get good prenatal care and to follow your health care provider's instructions. Prenatal care is all the medical care you receive before the baby's birth. This care will help prevent, find, and treat any problems during the pregnancy and childbirth.  Body changes during your first trimester  Your body goes through many changes during pregnancy. The changes vary from woman to woman.   You may gain or lose a couple of pounds at first.   You may feel sick to your stomach (nauseous) and you may throw up (vomit). If the vomiting is uncontrollable, call your health care provider.   You may tire easily.   You may develop headaches that can be relieved by medicines. All medicines should be approved by your health care provider.   You may urinate more often. Painful urination may mean you have a bladder infection.   You may develop heartburn as a result of your pregnancy.   You may develop constipation because certain hormones are causing the muscles that push stool through your intestines to slow down.   You may develop hemorrhoids or swollen veins (varicose veins).   Your breasts may begin to grow larger and become tender. Your nipples may stick out more, and the tissue that surrounds them (areola) may become darker.   Your gums may bleed and may be  sensitive to brushing and flossing.   Dark spots or blotches (chloasma, mask of pregnancy) may develop on your face. This will likely fade after the baby is born.   Your menstrual periods will stop.   You may have a loss of appetite.   You may develop cravings for certain kinds of food.   You may have changes in your emotions from day to day, such as being excited to be pregnant or being concerned that something may go wrong with the pregnancy and baby.   You may have more vivid and strange dreams.   You may have changes in your hair. These can include thickening of your hair, rapid growth, and changes in texture. Some women also have hair loss during or after pregnancy, or hair that feels dry or thin. Your hair will most likely return to normal after your baby is born.  What to expect at prenatal visits  During a routine prenatal visit:   You will be weighed to make sure you and the baby are growing normally.   Your blood pressure will be taken.   Your abdomen will be measured to track your baby's growth.   The fetal heartbeat will be listened to between weeks 10 and 14 of your pregnancy.   Test results from any previous visits will be discussed.  Your health care provider may ask you:     How you are feeling.   If you are feeling the baby move.   If you have had any abnormal symptoms, such as leaking fluid, bleeding, severe headaches, or abdominal cramping.   If you are using any tobacco products, including cigarettes, chewing tobacco, and electronic cigarettes.   If you have any questions.  Other tests that may be performed during your first trimester include:   Blood tests to find your blood type and to check for the presence of any previous infections. The tests will also be used to check for low iron levels (anemia) and protein on red blood cells (Rh antibodies). Depending on your risk factors, or if you previously had diabetes during pregnancy, you may have tests to check for high blood sugar  that affects pregnant women (gestational diabetes).   Urine tests to check for infections, diabetes, or protein in the urine.   An ultrasound to confirm the proper growth and development of the baby.   Fetal screens for spinal cord problems (spina bifida) and Down syndrome.   HIV (human immunodeficiency virus) testing. Routine prenatal testing includes screening for HIV, unless you choose not to have this test.   You may need other tests to make sure you and the baby are doing well.  Follow these instructions at home:  Medicines   Follow your health care provider's instructions regarding medicine use. Specific medicines may be either safe or unsafe to take during pregnancy.   Take a prenatal vitamin that contains at least 600 micrograms (mcg) of folic acid.   If you develop constipation, try taking a stool softener if your health care provider approves.  Eating and drinking     Eat a balanced diet that includes fresh fruits and vegetables, whole grains, good sources of protein such as meat, eggs, or tofu, and low-fat dairy. Your health care provider will help you determine the amount of weight gain that is right for you.   Avoid raw meat and uncooked cheese. These carry germs that can cause birth defects in the baby.   Eating four or five small meals rather than three large meals a day may help relieve nausea and vomiting. If you start to feel nauseous, eating a few soda crackers can be helpful. Drinking liquids between meals, instead of during meals, also seems to help ease nausea and vomiting.   Limit foods that are high in fat and processed sugars, such as fried and sweet foods.   To prevent constipation:  ? Eat foods that are high in fiber, such as fresh fruits and vegetables, whole grains, and beans.  ? Drink enough fluid to keep your urine clear or pale yellow.  Activity   Exercise only as directed by your health care provider. Most women can continue their usual exercise routine during  pregnancy. Try to exercise for 30 minutes at least 5 days a week. Exercising will help you:  ? Control your weight.  ? Stay in shape.  ? Be prepared for labor and delivery.   Experiencing pain or cramping in the lower abdomen or lower back is a good sign that you should stop exercising. Check with your health care provider before continuing with normal exercises.   Try to avoid standing for long periods of time. Move your legs often if you must stand in one place for a long time.   Avoid heavy lifting.   Wear low-heeled shoes and practice good posture.   You may continue to have sex unless your health care   provider tells you not to.  Relieving pain and discomfort   Wear a good support bra to relieve breast tenderness.   Take warm sitz baths to soothe any pain or discomfort caused by hemorrhoids. Use hemorrhoid cream if your health care provider approves.   Rest with your legs elevated if you have leg cramps or low back pain.   If you develop varicose veins in your legs, wear support hose. Elevate your feet for 15 minutes, 3-4 times a day. Limit salt in your diet.  Prenatal care   Schedule your prenatal visits by the twelfth week of pregnancy. They are usually scheduled monthly at first, then more often in the last 2 months before delivery.   Write down your questions. Take them to your prenatal visits.   Keep all your prenatal visits as told by your health care provider. This is important.  Safety   Wear your seat belt at all times when driving.   Make a list of emergency phone numbers, including numbers for family, friends, the hospital, and police and fire departments.  General instructions   Ask your health care provider for a referral to a local prenatal education class. Begin classes no later than the beginning of month 6 of your pregnancy.   Ask for help if you have counseling or nutritional needs during pregnancy. Your health care provider can offer advice or refer you to specialists for help  with various needs.   Do not use hot tubs, steam rooms, or saunas.   Do not douche or use tampons or scented sanitary pads.   Do not cross your legs for long periods of time.   Avoid cat litter boxes and soil used by cats. These carry germs that can cause birth defects in the baby and possibly loss of the fetus by miscarriage or stillbirth.   Avoid all smoking, herbs, alcohol, and medicines not prescribed by your health care provider. Chemicals in these products affect the formation and growth of the baby.   Do not use any products that contain nicotine or tobacco, such as cigarettes and e-cigarettes. If you need help quitting, ask your health care provider. You may receive counseling support and other resources to help you quit.   Schedule a dentist appointment. At home, brush your teeth with a soft toothbrush and be gentle when you floss.  Contact a health care provider if:   You have dizziness.   You have mild pelvic cramps, pelvic pressure, or nagging pain in the abdominal area.   You have persistent nausea, vomiting, or diarrhea.   You have a bad smelling vaginal discharge.   You have pain when you urinate.   You notice increased swelling in your face, hands, legs, or ankles.   You are exposed to fifth disease or chickenpox.   You are exposed to German measles (rubella) and have never had it.  Get help right away if:   You have a fever.   You are leaking fluid from your vagina.   You have spotting or bleeding from your vagina.   You have severe abdominal cramping or pain.   You have rapid weight gain or loss.   You vomit blood or material that looks like coffee grounds.   You develop a severe headache.   You have shortness of breath.   You have any kind of trauma, such as from a fall or a car accident.  Summary   The first trimester of pregnancy is from week 1 until   the end of week 13 (months 1 through 3).   Your body goes through many changes during pregnancy. The changes vary from  woman to woman.   You will have routine prenatal visits. During those visits, your health care provider will examine you, discuss any test results you may have, and talk with you about how you are feeling.  This information is not intended to replace advice given to you by your health care provider. Make sure you discuss any questions you have with your health care provider.  Document Released: 11/10/2001 Document Revised: 10/28/2016 Document Reviewed: 10/28/2016  [employer]vier Interactive Patient Education  2019 [employer]

## 2019-01-03 ENCOUNTER — Ambulatory Visit (INDEPENDENT_AMBULATORY_CARE_PROVIDER_SITE_OTHER): Payer: 59 | Admitting: General Practice

## 2019-01-03 DIAGNOSIS — Z3490 Encounter for supervision of normal pregnancy, unspecified, unspecified trimester: Secondary | ICD-10-CM

## 2019-01-03 DIAGNOSIS — O3680X0 Pregnancy with inconclusive fetal viability, not applicable or unspecified: Secondary | ICD-10-CM

## 2019-01-03 LAB — CULTURE, OB URINE: Culture: >=100000 — AB

## 2019-01-03 LAB — HCG, QUANTITATIVE, PREGNANCY: hCG, Beta Chain, Quant, S: 71 m[IU]/mL — ABNORMAL HIGH (ref <5)

## 2019-01-03 NOTE — Progress Notes (Signed)
Patient presents to office today for stat bhcg following recent MAU visit. Patient reports continued cramps with occasional sharp pains but it happens less frequently than when in MAU. Patient states she started to have spotting yesterday with wiping. Discussed with patient we are monitoring your bhcg levels today & asked she wait in lobby for results/updated plan of care. Patient verbalized understanding & had no questions at this time.  Reviewed results with Dr Earlene Plater who finds appropriate rise in bhcg levels, patient should have follow up ultrasound in 10 days with non stat bhcg. Scheduled u/s 2/14 @ 8am.   Informed patient of results, non stat bhcg on 2/14 & ultrasound appt. Ectopic precautions reviewed. Patient verbalized understanding & had no questions.  Chase Caller RN BSN 01/03/19

## 2019-01-03 NOTE — Progress Notes (Signed)
I have reviewed this chart and agree with the RN/CMA assessment and management.    K. Meryl Davis, M.D. Attending [facility] (Faculty Practice)   

## 2019-01-13 ENCOUNTER — Ambulatory Visit (INDEPENDENT_AMBULATORY_CARE_PROVIDER_SITE_OTHER): Payer: 59 | Admitting: *Deleted

## 2019-01-13 ENCOUNTER — Telehealth: Payer: Self-pay | Admitting: *Deleted

## 2019-01-13 ENCOUNTER — Other Ambulatory Visit: Payer: Self-pay

## 2019-01-13 ENCOUNTER — Ambulatory Visit (HOSPITAL_COMMUNITY)
Admission: RE | Admit: 2019-01-13 | Discharge: 2019-01-13 | Disposition: A | Discharge status: Home / Self Care | Payer: 59 | Source: Ambulatory Visit | Attending: Obstetrics and Gynecology | Admitting: Obstetrics and Gynecology

## 2019-01-13 DIAGNOSIS — Z3490 Encounter for supervision of normal pregnancy, unspecified, unspecified trimester: Secondary | ICD-10-CM

## 2019-01-13 DIAGNOSIS — O3680X0 Pregnancy with inconclusive fetal viability, not applicable or unspecified: Secondary | ICD-10-CM

## 2019-01-13 NOTE — Telephone Encounter (Signed)
I called Priscilla Ortiz back to give her the [place] appointment I scheduled for 01/24/19. She voices understanding.

## 2019-01-13 NOTE — Progress Notes (Signed)
Here for [place] results which were reviewed with Dr. Vergie Living.Patient denies any bleeding since last visit. States still having mild cramping only.  Informed patient [place] does not confirm live pregnancy and we recommend another [place] in 11 days. She agrees with Plan. Will call with appointment as [place] is due day after hospital move and [place] schedule not readily available. She voices understanding.  Linda,RN

## 2019-01-19 NOTE — Progress Notes (Signed)
I have reviewed the chart and agree with nursing staff's documentation of this patient's encounter.  Mayley Lish, MD 01/19/2019 10:47 AM    

## 2019-01-24 ENCOUNTER — Encounter: Payer: Self-pay | Admitting: Obstetrics & Gynecology

## 2019-01-24 ENCOUNTER — Ambulatory Visit (INDEPENDENT_AMBULATORY_CARE_PROVIDER_SITE_OTHER): Payer: 59

## 2019-01-24 ENCOUNTER — Ambulatory Visit (HOSPITAL_COMMUNITY)
Admission: RE | Admit: 2019-01-24 | Discharge: 2019-01-24 | Disposition: A | Discharge status: Home / Self Care | Payer: 59 | Source: Ambulatory Visit | Attending: Obstetrics and Gynecology | Admitting: Obstetrics and Gynecology

## 2019-01-24 DIAGNOSIS — O3680X0 Pregnancy with inconclusive fetal viability, not applicable or unspecified: Secondary | ICD-10-CM | POA: Insufficient documentation

## 2019-01-24 DIAGNOSIS — Z712 Person consulting for explanation of examination or test findings: Secondary | ICD-10-CM

## 2019-01-24 NOTE — Progress Notes (Signed)
Pt here today for OB US results.  US showed viable pregnancy.  Pt denies any pain or bleeding.  Notified pt of results and [place] pic given to pt.  Medications reconciled.  List of medications safe to take in pregnancy given.  Proof of pregnancy letter provided by the front office to start prenatal care.

## 2019-01-26 NOTE — Progress Notes (Signed)
I have reviewed this chart and agree with the RN/CMA assessment and management.    Gatlyn Lipari C Aseneth Hack, MD, FACOG Attending Physician, Faculty Practice [facility]  

## 2019-02-07 ENCOUNTER — Telehealth: Payer: Self-pay | Admitting: Family Medicine

## 2019-02-07 NOTE — Telephone Encounter (Signed)
Called patient about her missed appointment. Left a VM for her to call back if she would like to get rescheduled with [place].

## 2019-02-21 ENCOUNTER — Encounter: Payer: 59 | Admitting: Student

## 2020-05-16 IMAGING — US US OB TRANSVAGINAL
1 series · 15 of 28 positions shown · non-contrast
Comparison: None

CLINICAL DATA: Pregnancy of unknown anatomic location

EXAM:
TRANSVAGINAL OB ULTRASOUND
TECHNIQUE: Transvaginal ultrasound was performed for complete evaluation of the
gestation as well as the maternal uterus, adnexal regions, and
pelvic cul-de-sac.

[Series 1: us ob transvaginal · 34 acquisitions, 15 frames shown]
[im 1/34]
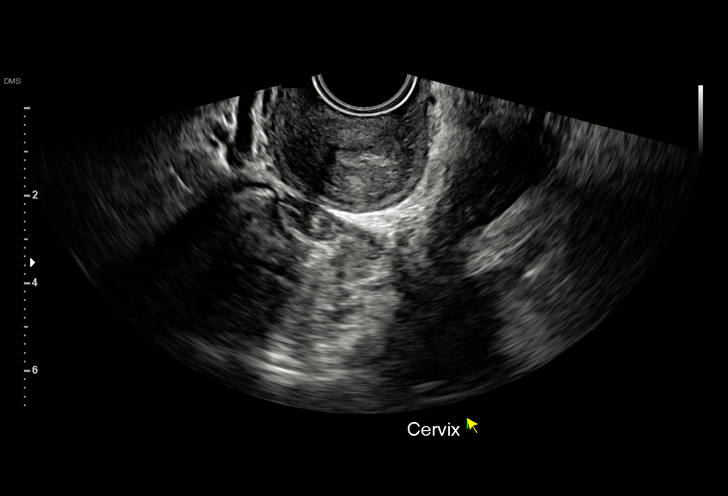
[im 3/34]
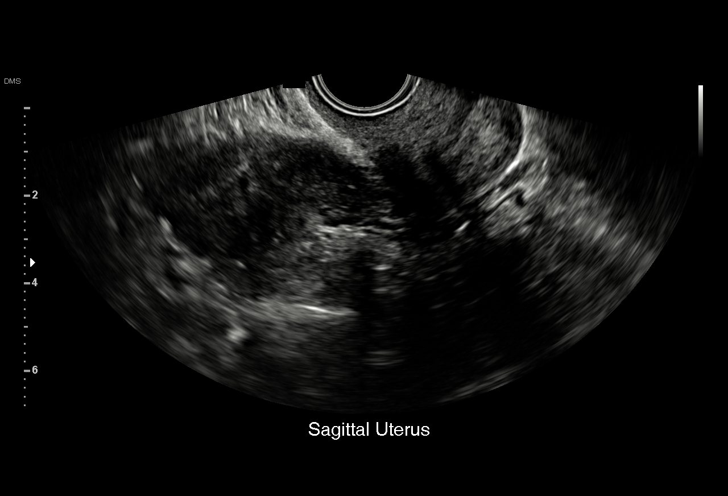
[im 5/34]
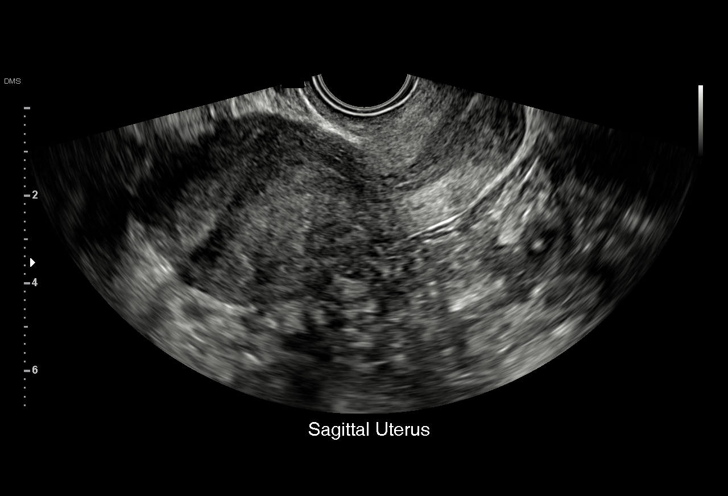
[im 8/34]
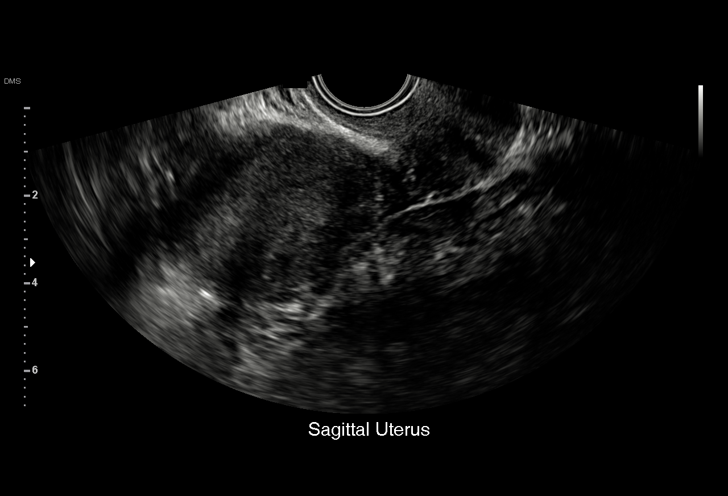
[im 10/34]
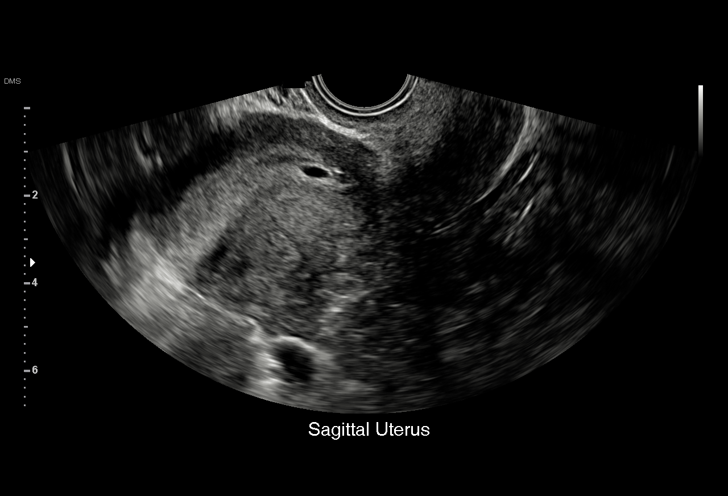
[im 13/34]
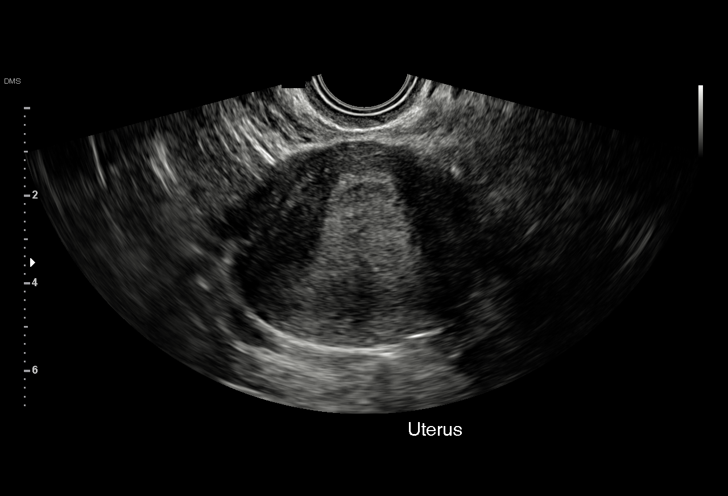
[im 15/34]
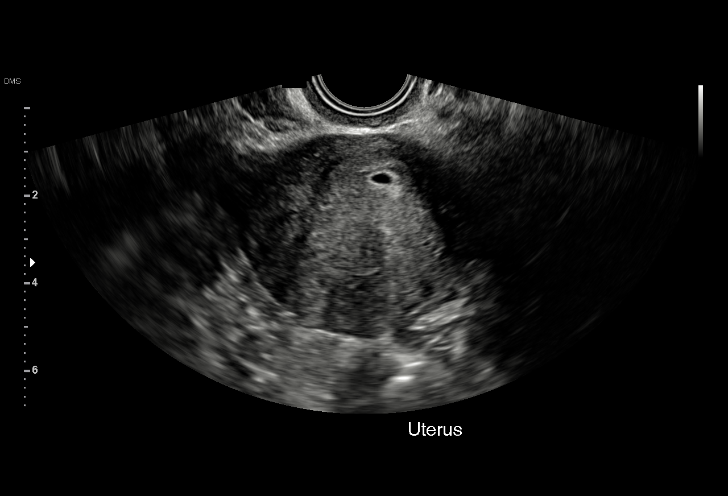
[im 18/34]
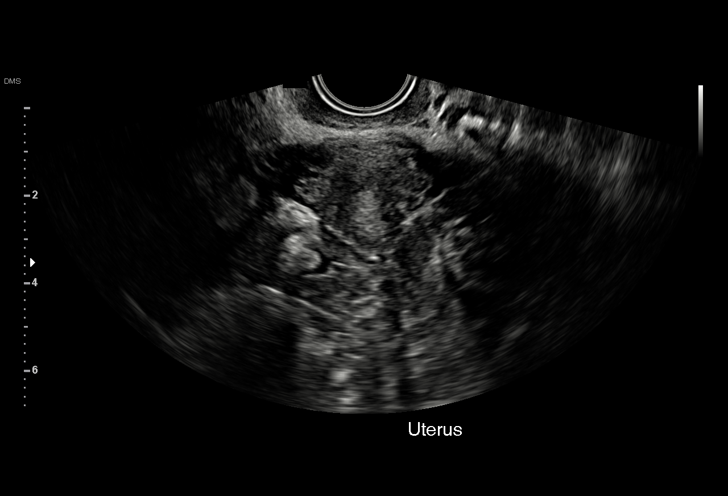
[im 19/34]
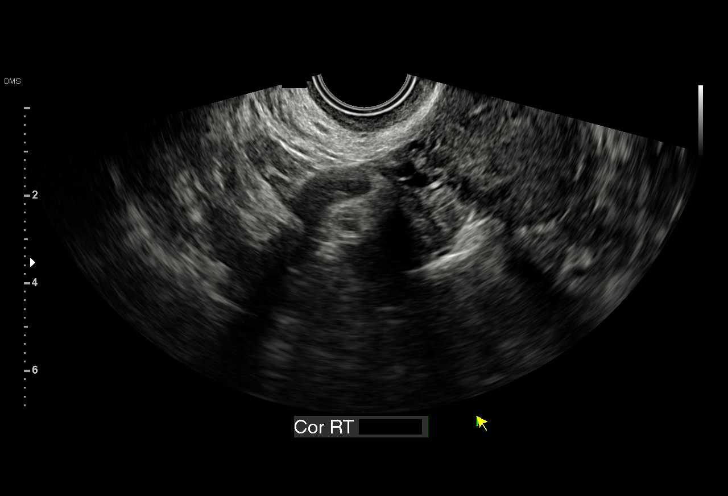
[im 21/34]
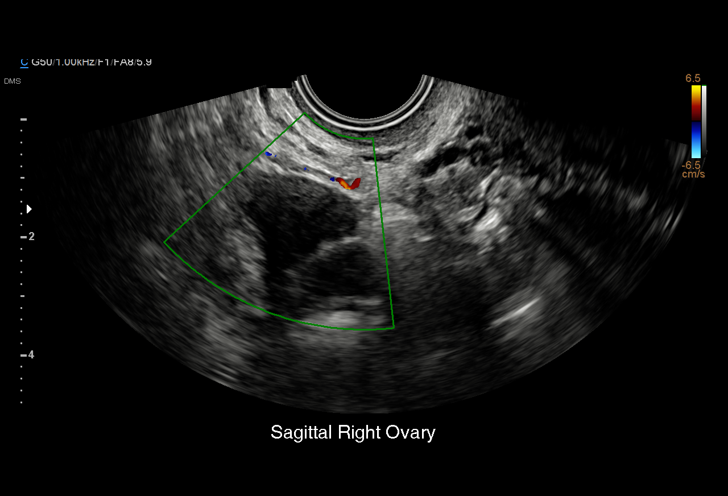
[im 24/34]
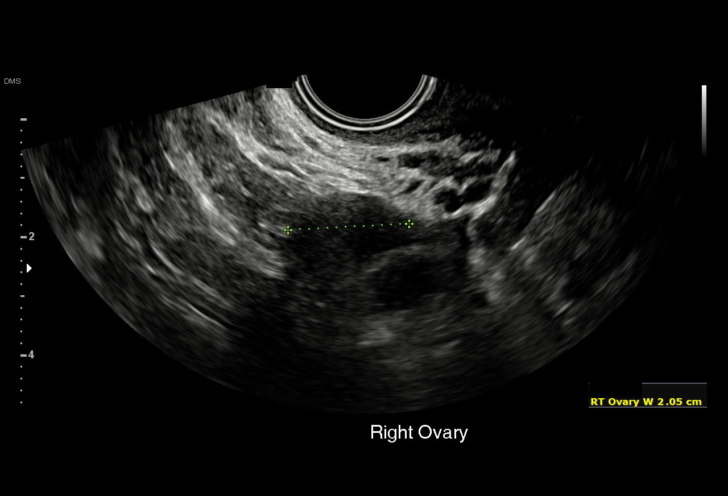
[im 26/34]
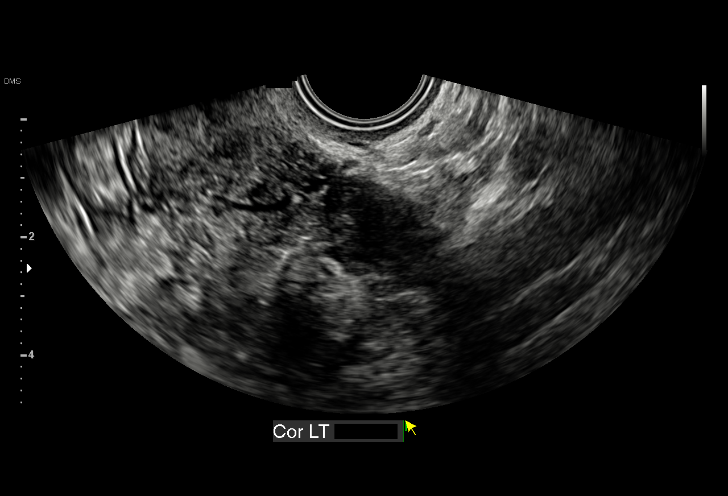
[im 29/34]
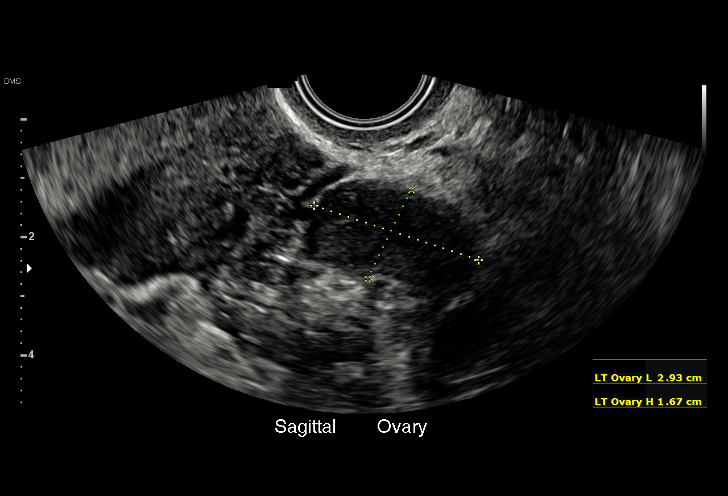
[im 31/34]
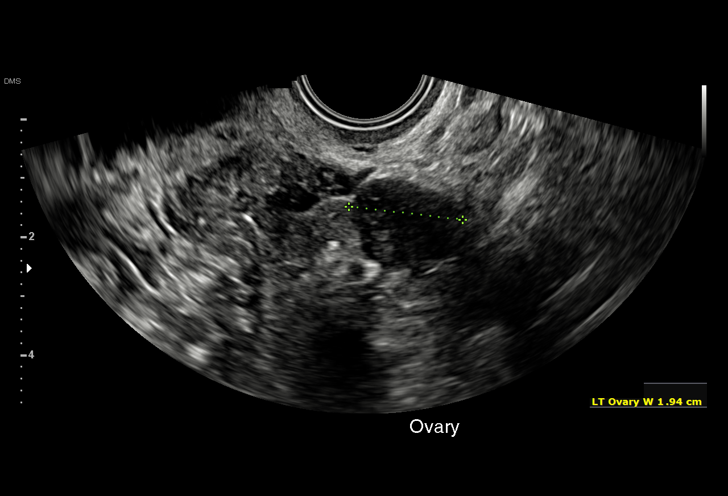
[im 34/34]
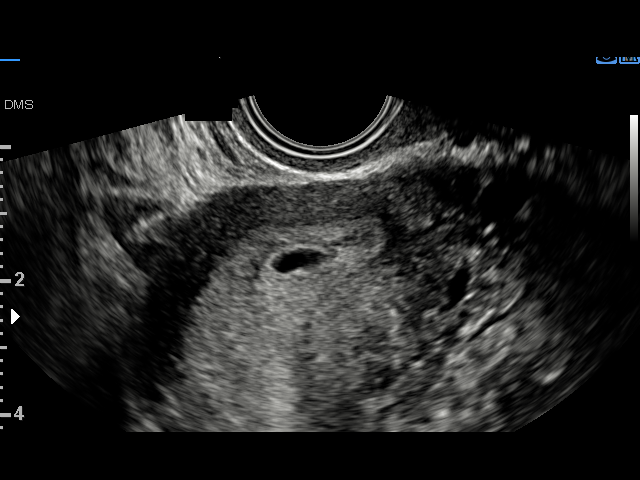

[15 of 28 positions shown; findings below may reference images not displayed]

FINDINGS: Intrauterine gestational sac: Present, single

Yolk sac:  Not visualized

Embryo:  Not visualized

Cardiac Activity: N/A

Heart Rate: N/A bpm

MSD: 5.3 mm   5 w   2 d

Subchorionic hemorrhage:  None visualize

Maternal uterus/adnexae:

RIGHT ovary normal size and morphology 2.1 x 1.5 x 2.1 cm.

LEFT ovary normal size and morphology 2.9 x 1.7 x 1.9 cm.

No free pelvic fluid or adnexal masses.
IMPRESSION: Intrauterine gestational sac identified at 5 weeks 2 days EGA by
mean sac diameter.

No fetal pole is identified to establish viability.

Remainder of exam unremarkable.

## 2020-05-27 IMAGING — US US OB TRANSVAGINAL
1 series · 15 of 28 positions shown · non-contrast
Comparison: 01/13/2019

CLINICAL DATA: Pregnant, for viability

EXAM:
TRANSVAGINAL OB ULTRASOUND
TECHNIQUE: Transvaginal ultrasound was performed for complete evaluation of the
gestation as well as the maternal uterus, adnexal regions, and
pelvic cul-de-sac.

[Series 1: us ob transvaginal · 41 acquisitions, 15 frames shown]
[im 1/41]
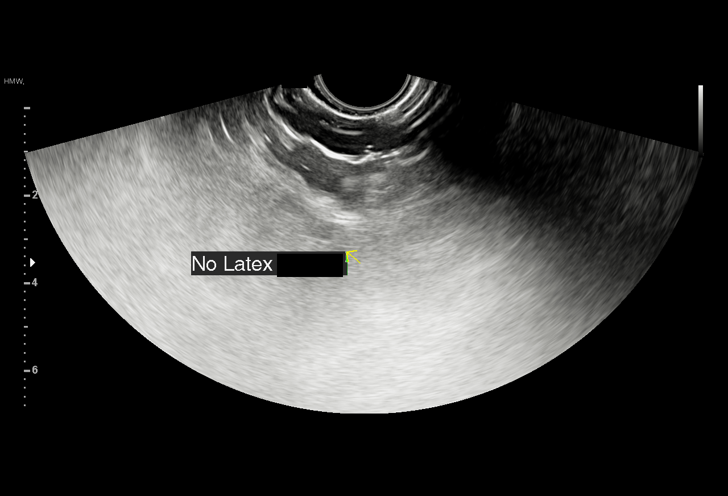
[im 3/41]
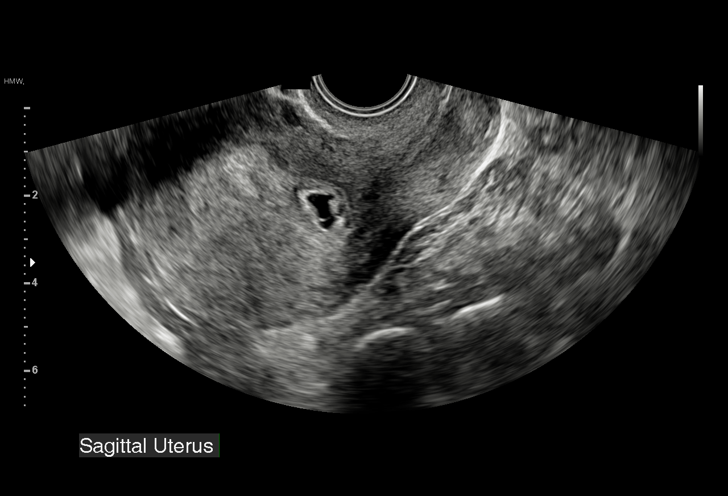
[im 6/41]
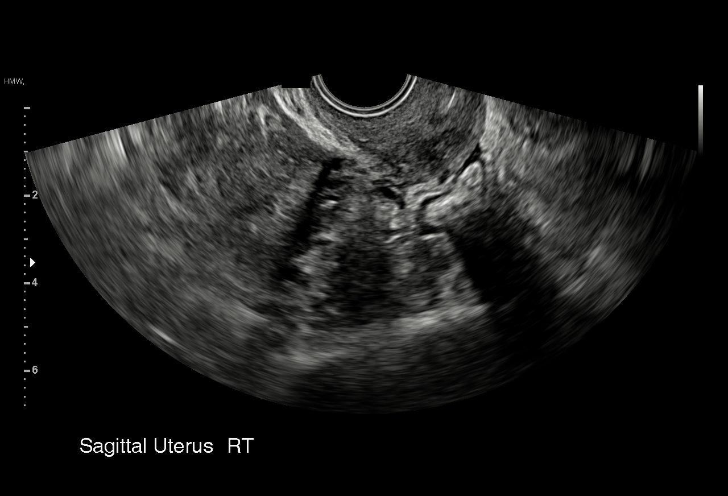
[im 9/41]
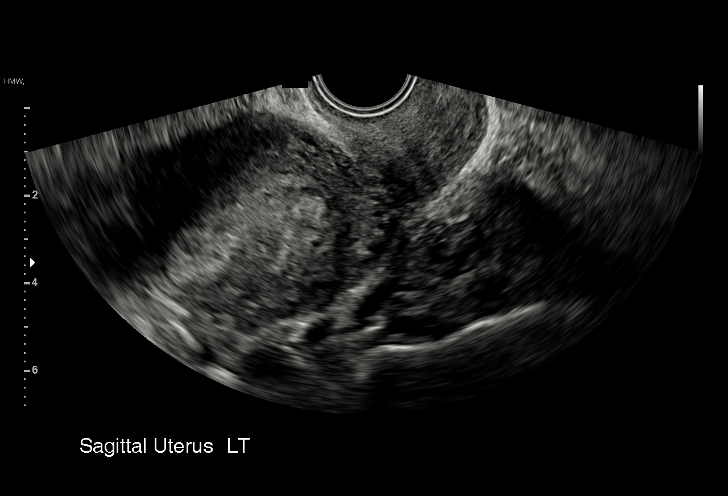
[im 12/41]
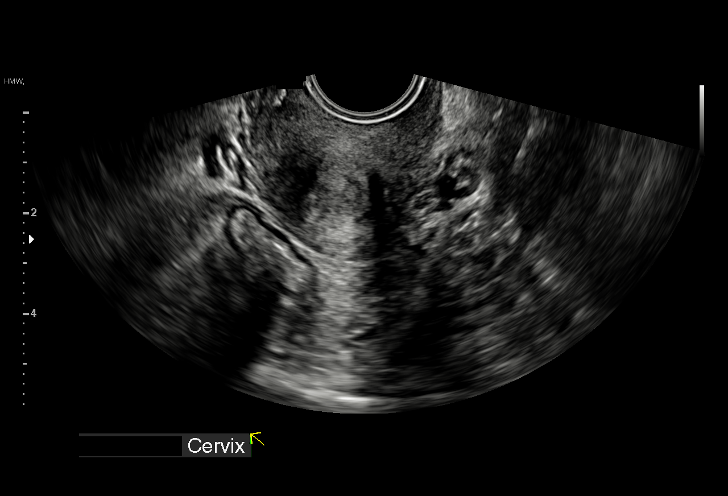
[im 15/41]
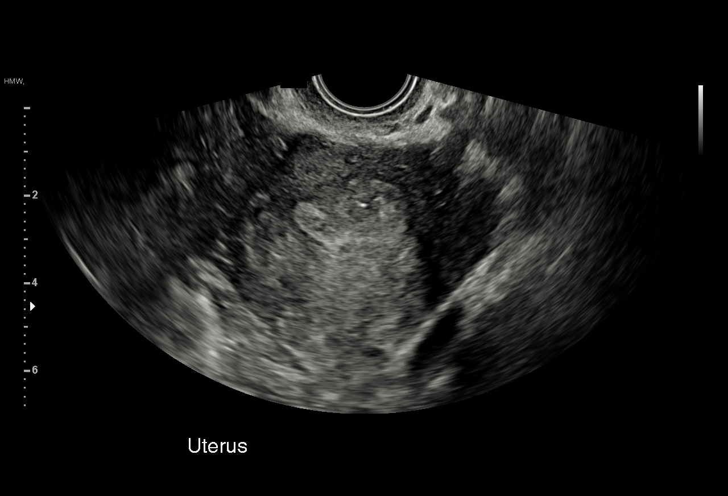
[im 18/41]
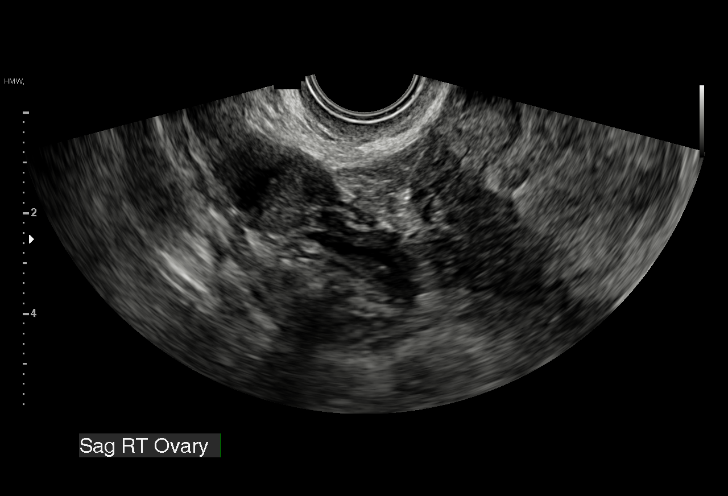
[im 21/41]
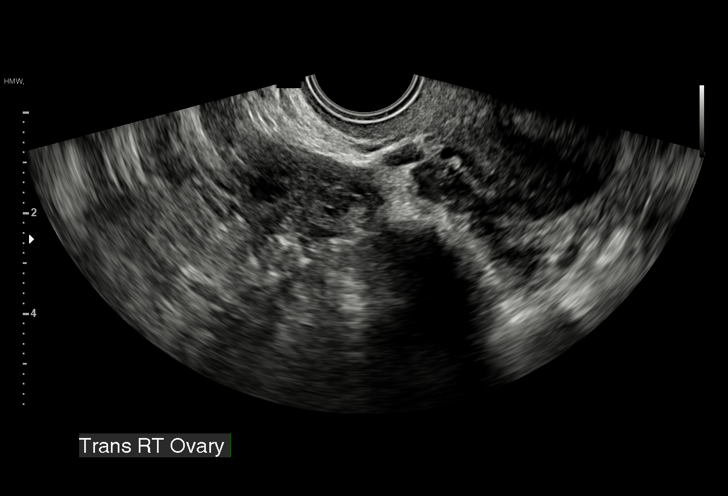
[im 23/41]
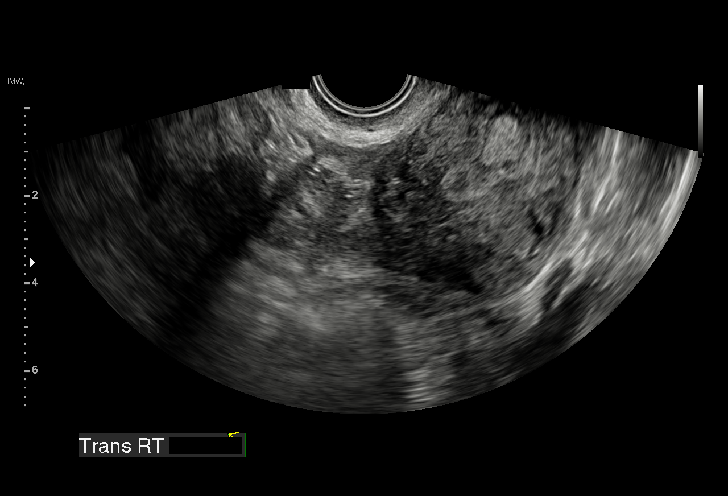
[im 26/41]
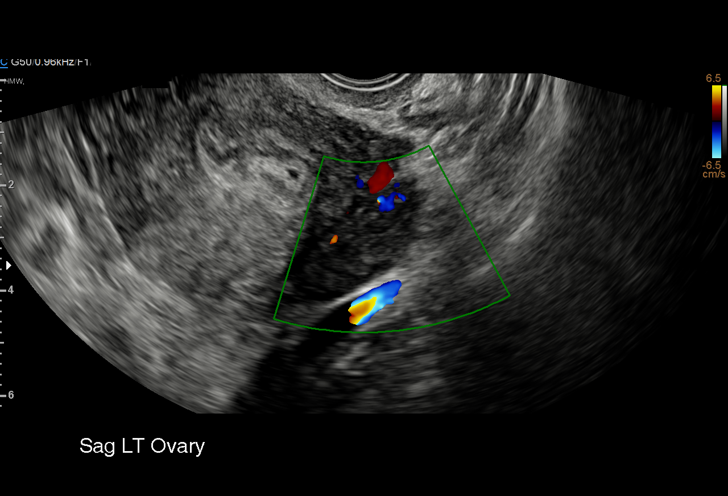
[im 29/41]
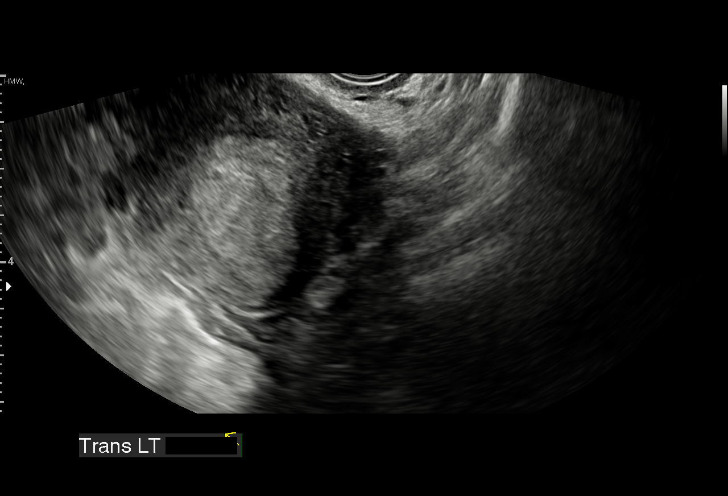
[im 32/41]
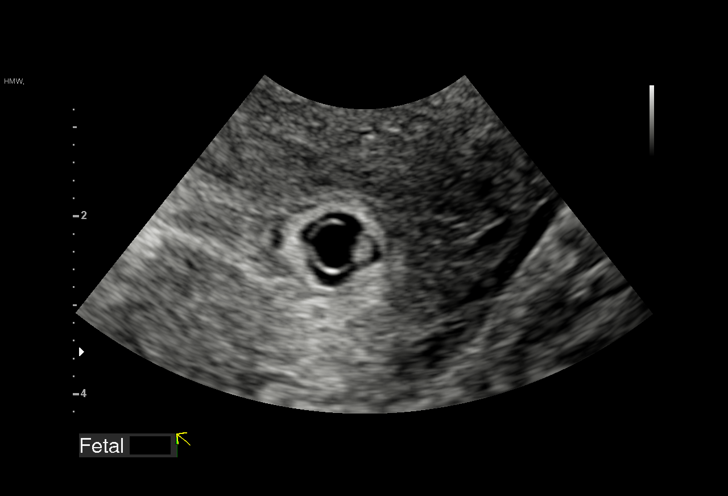
[im 35/41]
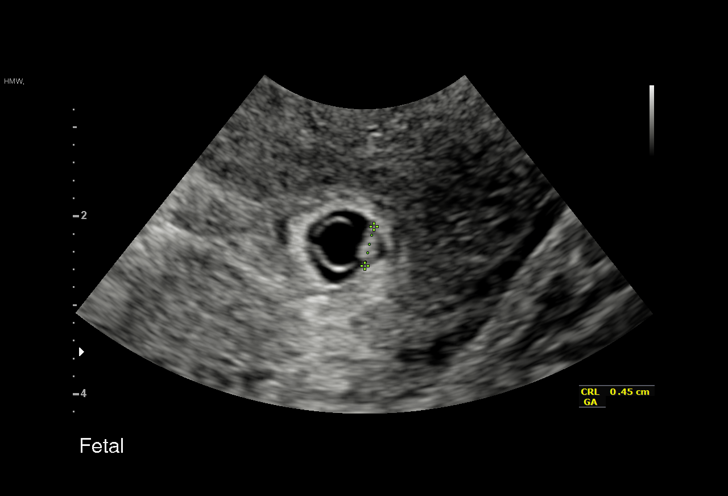
[im 38/41]
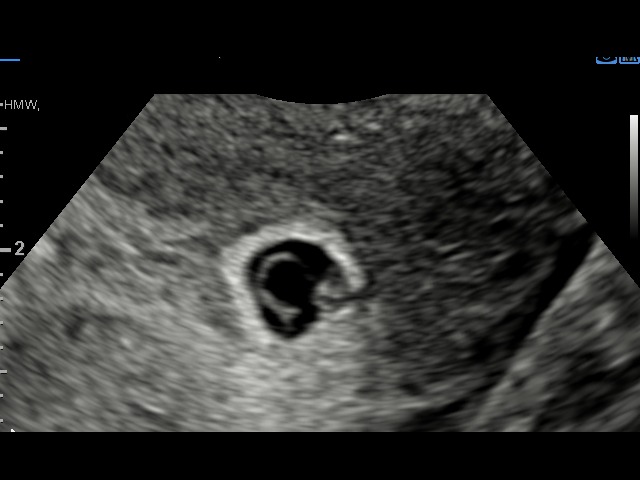
[im 41/41]
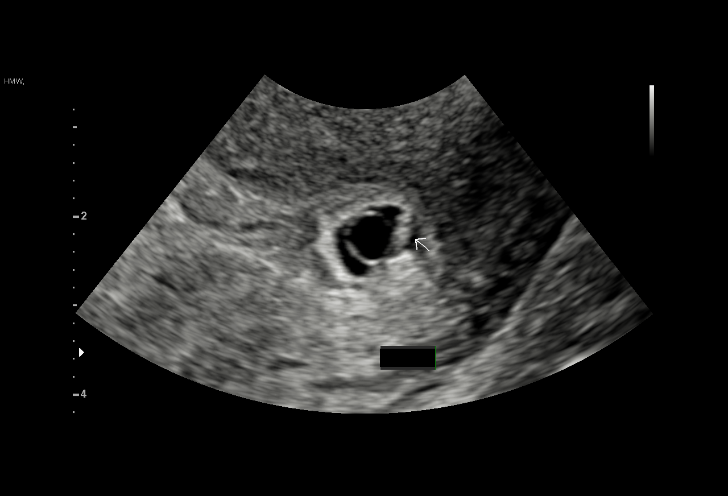

[15 of 28 positions shown; findings below may reference images not displayed]

FINDINGS: Intrauterine gestational sac: Single, mildly low lying

Yolk sac:  Visualized.

Embryo:  Visualized.

Cardiac Activity: Visualized.

Heart Rate: 121 bpm

CRL:   4.7 mm   6 w 1 d                  US EDC: 09/18/2019

Subchorionic hemorrhage:  None visualized.

Maternal uterus/adnexae: Bilateral ovaries are unremarkable.

No free fluid.
IMPRESSION: Single live intrauterine gestation, with estimated gestational age 6
weeks 1 day by crown-rump length, as above.
